# Patient Record
Sex: Male | Born: 1955 | Race: White | Hispanic: No | Marital: Married
Health system: Southern US, Community
[De-identification: ages and names within clinical notes are randomized; demographics above are authoritative.]

## PROBLEM LIST (undated history)

## (undated) DIAGNOSIS — I10 Essential (primary) hypertension: Secondary | ICD-10-CM

## (undated) DIAGNOSIS — E119 Type 2 diabetes mellitus without complications: Secondary | ICD-10-CM

## (undated) DIAGNOSIS — K859 Acute pancreatitis without necrosis or infection, unspecified: Secondary | ICD-10-CM

---

## 2014-05-02 ENCOUNTER — Emergency Department (HOSPITAL_BASED_OUTPATIENT_CLINIC_OR_DEPARTMENT_OTHER)
Admission: EM | Admit: 2014-05-02 | Discharge: 2014-05-02 | Disposition: A | Discharge status: Home / Self Care | Payer: Worker's Compensation | Attending: Emergency Medicine | Admitting: Emergency Medicine

## 2014-05-02 ENCOUNTER — Emergency Department (HOSPITAL_BASED_OUTPATIENT_CLINIC_OR_DEPARTMENT_OTHER): Payer: Worker's Compensation

## 2014-05-02 ENCOUNTER — Encounter (HOSPITAL_BASED_OUTPATIENT_CLINIC_OR_DEPARTMENT_OTHER): Payer: Self-pay | Admitting: Emergency Medicine

## 2014-05-02 DIAGNOSIS — Y929 Unspecified place or not applicable: Secondary | ICD-10-CM | POA: Insufficient documentation

## 2014-05-02 DIAGNOSIS — T07XXXA Unspecified multiple injuries, initial encounter: Secondary | ICD-10-CM | POA: Insufficient documentation

## 2014-05-02 DIAGNOSIS — S39012A Strain of muscle, fascia and tendon of lower back, initial encounter: Secondary | ICD-10-CM

## 2014-05-02 DIAGNOSIS — I10 Essential (primary) hypertension: Secondary | ICD-10-CM | POA: Insufficient documentation

## 2014-05-02 DIAGNOSIS — Z79899 Other long term (current) drug therapy: Secondary | ICD-10-CM | POA: Insufficient documentation

## 2014-05-02 DIAGNOSIS — E119 Type 2 diabetes mellitus without complications: Secondary | ICD-10-CM | POA: Insufficient documentation

## 2014-05-02 DIAGNOSIS — S199XXA Unspecified injury of neck, initial encounter: Secondary | ICD-10-CM

## 2014-05-02 DIAGNOSIS — S139XXA Sprain of joints and ligaments of unspecified parts of neck, initial encounter: Secondary | ICD-10-CM | POA: Insufficient documentation

## 2014-05-02 DIAGNOSIS — W11XXXA Fall on and from ladder, initial encounter: Secondary | ICD-10-CM | POA: Insufficient documentation

## 2014-05-02 DIAGNOSIS — S0993XA Unspecified injury of face, initial encounter: Secondary | ICD-10-CM | POA: Insufficient documentation

## 2014-05-02 DIAGNOSIS — S0990XA Unspecified injury of head, initial encounter: Secondary | ICD-10-CM | POA: Insufficient documentation

## 2014-05-02 DIAGNOSIS — Y9389 Activity, other specified: Secondary | ICD-10-CM | POA: Insufficient documentation

## 2014-05-02 DIAGNOSIS — Z8719 Personal history of other diseases of the digestive system: Secondary | ICD-10-CM | POA: Insufficient documentation

## 2014-05-02 HISTORY — DX: Type 2 diabetes mellitus without complications: E11.9

## 2014-05-02 HISTORY — DX: Essential (primary) hypertension: I10

## 2014-05-02 HISTORY — DX: Acute pancreatitis without necrosis or infection, unspecified: K85.90

## 2014-05-02 LAB — ETHANOL: Alcohol, Ethyl (B): <11 mg/dL (ref 0–11)

## 2014-05-02 LAB — COMPREHENSIVE METABOLIC PANEL
ALT: 22 U/L (ref 0–53)
AST: 19 U/L (ref 0–37)
Albumin: 3.5 g/dL (ref 3.5–5.2)
Alkaline Phosphatase: 82 U/L (ref 39–117)
Anion gap: 13 (ref 5–15)
BUN: 13 mg/dL (ref 6–23)
CO2: 27 meq/L (ref 19–32)
CREATININE: 1 mg/dL (ref 0.50–1.35)
Calcium: 9.9 mg/dL (ref 8.4–10.5)
Chloride: 99 mEq/L (ref 96–112)
GFR, EST NON AFRICAN AMERICAN: 82 mL/min — AB (ref >90)
GLUCOSE: 135 mg/dL — AB (ref 70–99)
Potassium: 3.8 mEq/L (ref 3.7–5.3)
Sodium: 139 mEq/L (ref 137–147)
Total Bilirubin: <0.2 mg/dL — ABNORMAL LOW (ref 0.3–1.2)
Total Protein: 7.1 g/dL (ref 6.0–8.3)

## 2014-05-02 LAB — PROTIME-INR
INR: 0.94 (ref 0.00–1.49)
Prothrombin Time: 12.6 seconds (ref 11.6–15.2)

## 2014-05-02 LAB — CBC
HCT: 44 % (ref 39.0–52.0)
Hemoglobin: 15.3 g/dL (ref 13.0–17.0)
MCH: 29.1 pg (ref 26.0–34.0)
MCHC: 34.8 g/dL (ref 30.0–36.0)
MCV: 83.7 fL (ref 78.0–100.0)
Platelets: 116 10*3/uL — ABNORMAL LOW (ref 150–400)
RBC: 5.26 MIL/uL (ref 4.22–5.81)
RDW: 14.6 % (ref 11.5–15.5)
WBC: 9.1 10*3/uL (ref 4.0–10.5)

## 2014-05-02 MED ORDER — CYCLOBENZAPRINE HCL 10 MG PO TABS: 10.0000 mg | ORAL_TABLET | Freq: Two times a day (BID) | ORAL | Status: AC | PRN

## 2014-05-02 MED ORDER — OXYCODONE-ACETAMINOPHEN 5-325 MG PO TABS
1.0000 | ORAL_TABLET | ORAL | Status: AC | PRN
Start: 2014-05-02 — End: ?

## 2014-05-02 MED ORDER — OXYCODONE-ACETAMINOPHEN 5-325 MG PO TABS
2.0000 | ORAL_TABLET | Freq: Once | ORAL | Status: AC
  Administered 2014-05-02: 2 via ORAL
  Filled 2014-05-02: qty 2

## 2014-05-02 NOTE — ED Provider Notes (Signed)
CSN: [id number]     Arrival date & time 05/02/14  1013 History   First MD Initiated Contact with Patient 05/02/14 1026     Chief Complaint  Patient presents with  . Fall     (Consider location/radiation/quality/duration/timing/severity/associated sxs/prior Treatment) HPI Comments: Patient presents after a fall. He states that he was working on a ladder and fell backward about 6 feet, landing on a concrete floor. He landed on his back in his head. He had a positive loss of consciousness. He was seen at an outside facility and per their report, he was drowsy. He was sent over here for further evaluation. He came over here and his safety officers car. He complains of a headache. He has no nausea or vomiting. He denies any chest pain or shortness of breath. He denies abdominal pain. He has pain in his right elbow and right knee as well as behind his left shoulder blade. He also has some pain in his low back. He has diabetic neuropathy but denies any new numbness or weakness to his extremities. He denies taking any anticoagulants.  Patient is a 58 y.o. male presenting with fall.  Fall Associated symptoms include headaches. Pertinent negatives include no chest pain, no abdominal pain and no shortness of breath.    Past Medical History  Diagnosis Date  . Diabetes mellitus without complication   . Hypertension   . Pancreatitis    History reviewed. No pertinent past surgical history. No family history on file. History  Substance Use Topics  . Smoking status: Not on file  . Smokeless tobacco: Current User  . Alcohol Use: Not on file    Review of Systems  Constitutional: Negative for fever, chills, diaphoresis and fatigue.  HENT: Negative for congestion, rhinorrhea and sneezing.   Eyes: Negative.   Respiratory: Negative for cough, chest tightness and shortness of breath.   Cardiovascular: Negative for chest pain and leg swelling.  Gastrointestinal: Negative for nausea, vomiting, abdominal  pain, diarrhea and blood in stool.  Genitourinary: Negative for frequency, hematuria, flank pain and difficulty urinating.  Musculoskeletal: Positive for arthralgias, back pain and neck pain.  Skin: Negative for rash.  Neurological: Positive for syncope and headaches. Negative for dizziness, speech difficulty, weakness and numbness.      Allergies  Meperidine and related and Morphine and related  Home Medications   Prior to Admission medications   Medication Sig Start Date End Date Taking? Authorizing Provider  HYDROcodone-acetaminophen (NORCO/VICODIN) 5-325 MG per tablet Take 1 tablet by mouth every 6 (six) hours as needed for moderate pain.   Yes Historical Provider, MD  lisinopril (PRINIVIL,ZESTRIL) 20 MG tablet Take 20 mg by mouth daily.   Yes Historical Provider, MD  metFORMIN (GLUMETZA) 500 MG (MOD) 24 hr tablet Take 500 mg by mouth 2 (two) times daily.   Yes Historical Provider, MD  promethazine (PHENERGAN) 25 MG tablet Take 25 mg by mouth every 6 (six) hours as needed for nausea or vomiting.   Yes Historical Provider, MD  tadalafil (CIALIS) 20 MG tablet Take 20 mg by mouth daily as needed for erectile dysfunction.   Yes Historical Provider, MD  cyclobenzaprine (FLEXERIL) 10 MG tablet Take 1 tablet (10 mg total) by mouth 2 (two) times daily as needed for muscle spasms. 05/02/14   Rolan BuccoMelanie Herlinda Heady, MD  oxyCODONE-acetaminophen (PERCOCET) 5-325 MG per tablet Take 1-2 tablets by mouth every 4 (four) hours as needed. 05/02/14   Rolan BuccoMelanie Tramayne Sebesta, MD   BP 167/88  Pulse 74  Temp(Src)  98.9 F (37.2 C) (Oral)  Resp 18  Ht 6' (1.829 m)  Wt 265 lb (120.203 kg)  BMI 35.93 kg/m2  SpO2 98% Physical Exam  Constitutional: He is oriented to person, place, and time. He appears well-developed and well-nourished.  HENT:  Head: Normocephalic and atraumatic.  Right Ear: External ear normal.  Left Ear: External ear normal.  Eyes: Pupils are equal, round, and reactive to light.  Neck:  Patient is in a  c-collar. He has tenderness throughout the cervical spine. He has a short neck. He has no tenderness to the thoracic spine. He has tenderness to the mid and lower lumbar spine. No step-offs or deformities are noted.  Cardiovascular: Normal rate, regular rhythm and normal heart sounds.   Pulmonary/Chest: Effort normal and breath sounds normal. No respiratory distress. He has no wheezes. He has no rales. He exhibits no tenderness.  There is no crepitus or deformity. There's no external signs of trauma. There is tenderness along the left scapula.  Abdominal: Soft. Bowel sounds are normal. There is no tenderness. There is no rebound and no guarding.  Musculoskeletal: Normal range of motion. He exhibits no edema.  Positive tenderness on palpation of the right elbow. There's also pain on palpation of the right knee.   Lymphadenopathy:    He has no cervical adenopathy.  Neurological: He is alert and oriented to person, place, and time.  Skin: Skin is warm and dry. No rash noted.  Psychiatric: He has a normal mood and affect.    ED Course  Procedures (including critical care time) Labs Review Results for orders placed during the hospital encounter of 05/02/14  COMPREHENSIVE METABOLIC PANEL      Result Value Ref Range   Sodium 139  137 - 147 mEq/L   Potassium 3.8  3.7 - 5.3 mEq/L   Chloride 99  96 - 112 mEq/L   CO2 27  19 - 32 mEq/L   Glucose, Bld 135 (*) 70 - 99 mg/dL   BUN 13  6 - 23 mg/dL   Creatinine, Ser 1.61  0.50 - 1.35 mg/dL   Calcium 9.9  8.4 - 09.6 mg/dL   Total Protein 7.1  6.0 - 8.3 g/dL   Albumin 3.5  3.5 - 5.2 g/dL   AST 19  0 - 37 U/L   ALT 22  0 - 53 U/L   Alkaline Phosphatase 82  39 - 117 U/L   Total Bilirubin <0.2 (*) 0.3 - 1.2 mg/dL   GFR calc non Af Amer 82 (*) >90 mL/min   GFR calc Af Amer >90  >90 mL/min   Anion gap 13  5 - 15  CBC      Result Value Ref Range   WBC 9.1  4.0 - 10.5 K/uL   RBC 5.26  4.22 - 5.81 MIL/uL   Hemoglobin 15.3  13.0 - 17.0 g/dL   HCT 04.5   40.9 - 81.1 %   MCV 83.7  78.0 - 100.0 fL   MCH 29.1  26.0 - 34.0 pg   MCHC 34.8  30.0 - 36.0 g/dL   RDW 91.4  78.2 - 95.6 %   Platelets 116 (*) 150 - 400 K/uL  ETHANOL      Result Value Ref Range   Alcohol, Ethyl (B) <11  0 - 11 mg/dL  PROTIME-INR      Result Value Ref Range   Prothrombin Time 12.6  11.6 - 15.2 seconds   INR 0.94  0.00 - 1.49  Dg Chest 2 View  05/02/2014   CLINICAL DATA:  Fall.  EXAM: CHEST  2 VIEW  COMPARISON:  None.  FINDINGS: Mediastinum and hilar structures are normal. Poor inspiration mild basilar atelectasis. Lungs are otherwise clear. No pleural effusion or pneumothorax. Heart size normal. Surgical clips right upper quadrant. No acute bony abnormality.  IMPRESSION: No active cardiopulmonary disease.   Electronically Signed   By: Maisie Fus  Register   On: 05/02/2014 11:18   Dg Lumbar Spine Complete  05/02/2014   CLINICAL DATA:  Fall  EXAM: LUMBAR SPINE - COMPLETE 4+ VIEW  COMPARISON:  None.  FINDINGS: Anatomic alignment. No vertebral compression deformity. Severe narrowing of the L4-5 and L5-S1 discs. Facet arthropathy at L4-5 and L5-S1. Prominent vascular calcifications.  IMPRESSION: No acute bony pathology.  Degenerative change.   Electronically Signed   By: Maryclare Bean M.D.   On: 05/02/2014 11:22   Dg Elbow Complete Right  05/02/2014   CLINICAL DATA:  Fall  EXAM: RIGHT ELBOW - COMPLETE 3+ VIEW  COMPARISON:  None.  FINDINGS: No acute fracture.  No dislocation.  No joint effusion.  IMPRESSION: No acute bony pathology.   Electronically Signed   By: Maryclare Bean M.D.   On: 05/02/2014 11:20   Ct Head Wo Contrast  05/02/2014   CLINICAL DATA:  Status post fall, loss consciousness  EXAM: CT HEAD WITHOUT CONTRAST  CT CERVICAL SPINE WITHOUT CONTRAST  TECHNIQUE: Multidetector CT imaging of the head and cervical spine was performed following the standard protocol without intravenous contrast. Multiplanar CT image reconstructions of the cervical spine were also generated.  COMPARISON:   None.  FINDINGS: CT HEAD FINDINGS  There is no evidence of mass effect, midline shift, or extra-axial fluid collections. There is no evidence of a space-occupying lesion or intracranial hemorrhage. There is no evidence of a cortical-based area of acute infarction. There is generalized cerebral atrophy.  The ventricles and sulci are appropriate for the patient's age. The basal cisterns are patent.  Visualized portions of the orbits are unremarkable. The visualized portions of the paranasal sinuses and mastoid air cells are unremarkable. Cerebrovascular atherosclerotic calcifications are noted.  The osseous structures are unremarkable.  CT CERVICAL SPINE FINDINGS  The alignment is anatomic. The vertebral body heights are maintained. There is no acute fracture. There is no static listhesis. The prevertebral soft tissues are normal. The intraspinal soft tissues are not fully imaged on this examination due to poor soft tissue contrast, but there is no gross soft tissue abnormality.  There is degenerative disc disease with disc height loss at C5-6 and C6-7. There is loss the normal cervical lordosis with straightening. There are mild broad-based disc bulges at C5-6 and C6-7. There is mild left foraminal stenosis at C4-5. There is bilateral uncovertebral degenerative changes C6-7 resulting in foraminal encroachment.  The visualized portions of the lung apices demonstrate no focal abnormality.There is bilateral carotid artery atherosclerosis.  IMPRESSION: 1. No acute intracranial pathology. 2. No acute osseous injury of the cervical spine. 3. Cervical spine spondylosis as described above.   Electronically Signed   By: Elige Ko   On: 05/02/2014 11:12   Ct Cervical Spine Wo Contrast  05/02/2014   CLINICAL DATA:  Status post fall, loss consciousness  EXAM: CT HEAD WITHOUT CONTRAST  CT CERVICAL SPINE WITHOUT CONTRAST  TECHNIQUE: Multidetector CT imaging of the head and cervical spine was performed following the standard  protocol without intravenous contrast. Multiplanar CT image reconstructions of the cervical spine were also generated.  COMPARISON:  None.  FINDINGS: CT HEAD FINDINGS  There is no evidence of mass effect, midline shift, or extra-axial fluid collections. There is no evidence of a space-occupying lesion or intracranial hemorrhage. There is no evidence of a cortical-based area of acute infarction. There is generalized cerebral atrophy.  The ventricles and sulci are appropriate for the patient's age. The basal cisterns are patent.  Visualized portions of the orbits are unremarkable. The visualized portions of the paranasal sinuses and mastoid air cells are unremarkable. Cerebrovascular atherosclerotic calcifications are noted.  The osseous structures are unremarkable.  CT CERVICAL SPINE FINDINGS  The alignment is anatomic. The vertebral body heights are maintained. There is no acute fracture. There is no static listhesis. The prevertebral soft tissues are normal. The intraspinal soft tissues are not fully imaged on this examination due to poor soft tissue contrast, but there is no gross soft tissue abnormality.  There is degenerative disc disease with disc height loss at C5-6 and C6-7. There is loss the normal cervical lordosis with straightening. There are mild broad-based disc bulges at C5-6 and C6-7. There is mild left foraminal stenosis at C4-5. There is bilateral uncovertebral degenerative changes C6-7 resulting in foraminal encroachment.  The visualized portions of the lung apices demonstrate no focal abnormality.There is bilateral carotid artery atherosclerosis.  IMPRESSION: 1. No acute intracranial pathology. 2. No acute osseous injury of the cervical spine. 3. Cervical spine spondylosis as described above.   Electronically Signed   By: Elige Ko   On: 05/02/2014 11:12   Dg Knee Complete 4 Views Right  05/02/2014   CLINICAL DATA:  Fall.  EXAM: RIGHT KNEE - COMPLETE 4+ VIEW  COMPARISON:  None.  FINDINGS:  Peripheral vascular calcification consistent peripheral vascular disease noted. No acute bony or joint abnormality identified.  IMPRESSION: 1.  No acute abnormality.  2.  Peripheral vascular disease.   Electronically Signed   By: Maisie Fus  Register   On: 05/02/2014 11:44      Imaging Review Dg Chest 2 View  05/02/2014   CLINICAL DATA:  Fall.  EXAM: CHEST  2 VIEW  COMPARISON:  None.  FINDINGS: Mediastinum and hilar structures are normal. Poor inspiration mild basilar atelectasis. Lungs are otherwise clear. No pleural effusion or pneumothorax. Heart size normal. Surgical clips right upper quadrant. No acute bony abnormality.  IMPRESSION: No active cardiopulmonary disease.   Electronically Signed   By: Maisie Fus  Register   On: 05/02/2014 11:18   Dg Lumbar Spine Complete  05/02/2014   CLINICAL DATA:  Fall  EXAM: LUMBAR SPINE - COMPLETE 4+ VIEW  COMPARISON:  None.  FINDINGS: Anatomic alignment. No vertebral compression deformity. Severe narrowing of the L4-5 and L5-S1 discs. Facet arthropathy at L4-5 and L5-S1. Prominent vascular calcifications.  IMPRESSION: No acute bony pathology.  Degenerative change.   Electronically Signed   By: Maryclare Bean M.D.   On: 05/02/2014 11:22   Dg Elbow Complete Right  05/02/2014   CLINICAL DATA:  Fall  EXAM: RIGHT ELBOW - COMPLETE 3+ VIEW  COMPARISON:  None.  FINDINGS: No acute fracture.  No dislocation.  No joint effusion.  IMPRESSION: No acute bony pathology.   Electronically Signed   By: Maryclare Bean M.D.   On: 05/02/2014 11:20   Ct Head Wo Contrast  05/02/2014   CLINICAL DATA:  Status post fall, loss consciousness  EXAM: CT HEAD WITHOUT CONTRAST  CT CERVICAL SPINE WITHOUT CONTRAST  TECHNIQUE: Multidetector CT imaging of the head and cervical spine was performed following the  standard protocol without intravenous contrast. Multiplanar CT image reconstructions of the cervical spine were also generated.  COMPARISON:  None.  FINDINGS: CT HEAD FINDINGS  There is no evidence of mass  effect, midline shift, or extra-axial fluid collections. There is no evidence of a space-occupying lesion or intracranial hemorrhage. There is no evidence of a cortical-based area of acute infarction. There is generalized cerebral atrophy.  The ventricles and sulci are appropriate for the patient's age. The basal cisterns are patent.  Visualized portions of the orbits are unremarkable. The visualized portions of the paranasal sinuses and mastoid air cells are unremarkable. Cerebrovascular atherosclerotic calcifications are noted.  The osseous structures are unremarkable.  CT CERVICAL SPINE FINDINGS  The alignment is anatomic. The vertebral body heights are maintained. There is no acute fracture. There is no static listhesis. The prevertebral soft tissues are normal. The intraspinal soft tissues are not fully imaged on this examination due to poor soft tissue contrast, but there is no gross soft tissue abnormality.  There is degenerative disc disease with disc height loss at C5-6 and C6-7. There is loss the normal cervical lordosis with straightening. There are mild broad-based disc bulges at C5-6 and C6-7. There is mild left foraminal stenosis at C4-5. There is bilateral uncovertebral degenerative changes C6-7 resulting in foraminal encroachment.  The visualized portions of the lung apices demonstrate no focal abnormality.There is bilateral carotid artery atherosclerosis.  IMPRESSION: 1. No acute intracranial pathology. 2. No acute osseous injury of the cervical spine. 3. Cervical spine spondylosis as described above.   Electronically Signed   By: Elige Ko   On: 05/02/2014 11:12   Ct Cervical Spine Wo Contrast  05/02/2014   CLINICAL DATA:  Status post fall, loss consciousness  EXAM: CT HEAD WITHOUT CONTRAST  CT CERVICAL SPINE WITHOUT CONTRAST  TECHNIQUE: Multidetector CT imaging of the head and cervical spine was performed following the standard protocol without intravenous contrast. Multiplanar CT image  reconstructions of the cervical spine were also generated.  COMPARISON:  None.  FINDINGS: CT HEAD FINDINGS  There is no evidence of mass effect, midline shift, or extra-axial fluid collections. There is no evidence of a space-occupying lesion or intracranial hemorrhage. There is no evidence of a cortical-based area of acute infarction. There is generalized cerebral atrophy.  The ventricles and sulci are appropriate for the patient's age. The basal cisterns are patent.  Visualized portions of the orbits are unremarkable. The visualized portions of the paranasal sinuses and mastoid air cells are unremarkable. Cerebrovascular atherosclerotic calcifications are noted.  The osseous structures are unremarkable.  CT CERVICAL SPINE FINDINGS  The alignment is anatomic. The vertebral body heights are maintained. There is no acute fracture. There is no static listhesis. The prevertebral soft tissues are normal. The intraspinal soft tissues are not fully imaged on this examination due to poor soft tissue contrast, but there is no gross soft tissue abnormality.  There is degenerative disc disease with disc height loss at C5-6 and C6-7. There is loss the normal cervical lordosis with straightening. There are mild broad-based disc bulges at C5-6 and C6-7. There is mild left foraminal stenosis at C4-5. There is bilateral uncovertebral degenerative changes C6-7 resulting in foraminal encroachment.  The visualized portions of the lung apices demonstrate no focal abnormality.There is bilateral carotid artery atherosclerosis.  IMPRESSION: 1. No acute intracranial pathology. 2. No acute osseous injury of the cervical spine. 3. Cervical spine spondylosis as described above.   Electronically Signed   By: Elige Ko  On: 05/02/2014 11:12   Dg Knee Complete 4 Views Right  05/02/2014   CLINICAL DATA:  Fall.  EXAM: RIGHT KNEE - COMPLETE 4+ VIEW  COMPARISON:  None.  FINDINGS: Peripheral vascular calcification consistent peripheral  vascular disease noted. No acute bony or joint abnormality identified.  IMPRESSION: 1.  No acute abnormality.  2.  Peripheral vascular disease.   Electronically Signed   By: Maisie Fus  Register   On: 05/02/2014 11:44     EKG Interpretation None      MDM   Final diagnoses:  Head injury, initial encounter  Back strain, initial encounter  Multiple contusions    Patient presents after a fall. He is alert and oriented and neurologically intact. His CT scan is negative for intracranial hemorrhage. He has no evident cervical spine injuries. His other x-rays are unremarkable. He was discharged home with prescriptions for Percocet and Flexeril. He was encouraged to followup with his primary care physician at [facility] for recheck on his blood pressure. I also advised him he's got some arterial disease that was seen on his x-rays.    Rolan Bucco, MD 05/02/14 [phone]

## 2014-05-02 NOTE — Discharge Instructions (Signed)
Back Pain, Adult °Low back pain is very common. About 1 in 5 people have back pain. The cause of low back pain is rarely dangerous. The pain often gets better over time. About half of people with a sudden onset of back pain feel better in just 2 weeks. About 8 in 10 people feel better by 6 weeks.  °CAUSES °Some common causes of back pain include: °· Strain of the muscles or ligaments supporting the spine. °· Wear and tear (degeneration) of the spinal discs. °· Arthritis. °· Direct injury to the back. °DIAGNOSIS °Most of the time, the direct cause of low back pain is not known. However, back pain can be treated effectively even when the exact cause of the pain is unknown. Answering your caregiver's questions about your overall health and symptoms is one of the most accurate ways to make sure the cause of your pain is not dangerous. If your caregiver needs more information, he or she may order lab work or imaging tests (X-rays or MRIs). However, even if imaging tests show changes in your back, this usually does not require surgery. °HOME CARE INSTRUCTIONS °For many people, back pain returns. Since low back pain is rarely dangerous, it is often a condition that people can learn to manage on their own.  °· Remain active. It is stressful on the back to sit or stand in one place. Do not sit, drive, or stand in one place for more than 30 minutes at a time. Take short walks on level surfaces as soon as pain allows. Try to increase the length of time you walk each day. °· Do not stay in bed. Resting more than 1 or 2 days can delay your recovery. °· Do not avoid exercise or work. Your body is made to move. It is not dangerous to be active, even though your back may hurt. Your back will likely heal faster if you return to being active before your pain is gone. °· Pay attention to your body when you  bend and lift. Many people have less discomfort when lifting if they bend their knees, keep the load close to their bodies, and  avoid twisting. Often, the most comfortable positions are those that put less stress on your recovering back. °· Find a comfortable position to sleep. Use a firm mattress and lie on your side with your knees slightly bent. If you lie on your back, put a pillow under your knees. °· Only take over-the-counter or prescription medicines as directed by your caregiver. Over-the-counter medicines to reduce pain and inflammation are often the most helpful. Your caregiver may prescribe muscle relaxant drugs. These medicines help dull your pain so you can more quickly return to your normal activities and healthy exercise. °· Put ice on the injured area. °¨ Put ice in a plastic bag. °¨ Place a towel between your skin and the bag. °¨ Leave the ice on for 15-20 minutes, 03-04 times a day for the first 2 to 3 days. After that, ice and heat may be alternated to reduce pain and spasms. °· Ask your caregiver about trying back exercises and gentle massage. This may be of some benefit. °· Avoid feeling anxious or stressed. Stress increases muscle tension and can worsen back pain. It is important to recognize when you are anxious or stressed and learn ways to manage it. Exercise is a great option. °SEEK MEDICAL CARE IF: °· You have pain that is not relieved with rest or medicine. °· You have pain that does not improve in 1 week. °· You have new symptoms. °· You are generally not feeling well. °SEEK   IMMEDIATE MEDICAL CARE IF:   You have pain that radiates from your back into your legs.  You develop new bowel or bladder control problems.  You have unusual weakness or numbness in your arms or legs.  You develop nausea or vomiting.  You develop abdominal pain.  You feel faint. Document Released: 09/14/2005 Document Revised: 03/15/2012 Document Reviewed: 01/16/2014 [facility] Patient Information 2015 [place], [place]. This information is not intended to replace advice given to you by your health care provider. Make sure you  discuss any questions you have with your health care provider.  Concussion A concussion, or closed-head injury, is a brain injury caused by a direct blow to the head or by a quick and sudden movement (jolt) of the head or neck. Concussions are usually not life-threatening. Even so, the effects of a concussion can be serious. If you have had a concussion before, you are more likely to experience concussion-like symptoms after a direct blow to the head.  CAUSES  Direct blow to the head, such as from running into another player during a soccer game, being hit in a fight, or hitting your head on a hard surface.  A jolt of the head or neck that causes the brain to move back and forth inside the skull, such as in a car crash. SIGNS AND SYMPTOMS The signs of a concussion can be hard to notice. Early on, they may be missed by you, family members, and health care providers. You may look fine but act or feel differently. Symptoms are usually temporary, but they may last for days, weeks, or even longer. Some symptoms may appear right away while others may not show up for hours or days. Every head injury is different. Symptoms include:  Mild to moderate headaches that will not go away.  A feeling of pressure inside your head.  Having more trouble than usual:  Learning or remembering things you have heard.  Answering questions.  Paying attention or concentrating.  Organizing daily tasks.  Making decisions and solving problems.  Slowness in thinking, acting or reacting, speaking, or reading.  Getting lost or being easily confused.  Feeling tired all the time or lacking energy (fatigued).  Feeling drowsy.  Sleep disturbances.  Sleeping more than usual.  Sleeping less than usual.  Trouble falling asleep.  Trouble sleeping (insomnia).  Loss of balance or feeling lightheaded or dizzy.  Nausea or vomiting.  Numbness or tingling.  Increased sensitivity  to:  Sounds.  Lights.  Distractions.  Vision problems or eyes that tire easily.  Diminished sense of taste or smell.  Ringing in the ears.  Mood changes such as feeling sad or anxious.  Becoming easily irritated or angry for little or no reason.  Lack of motivation.  Seeing or hearing things other people do not see or hear (hallucinations). DIAGNOSIS Your health care provider can usually diagnose a concussion based on a description of your injury and symptoms. He or she will ask whether you passed out (lost consciousness) and whether you are having trouble remembering events that happened right before and during your injury. Your evaluation might include:  A brain scan to look for signs of injury to the brain. Even if the test shows no injury, you may still have a concussion.  Blood tests to be sure other problems are not present. TREATMENT  Concussions are usually treated in an emergency department, in urgent care, or at a clinic. You may need to stay in the hospital overnight for further treatment.  Tell your health care provider if you are taking any medicines, including prescription medicines, over-the-counter medicines, and natural remedies. Some medicines, such as blood thinners (anticoagulants) and aspirin, may increase the chance of complications. Also tell your health care provider whether you have had alcohol or are taking illegal drugs. This information may affect treatment.  Your health care provider will send you home with important instructions to follow.  How fast you will recover from a concussion depends on many factors. These factors include how severe your concussion is, what part of your brain was injured, your age, and how healthy you were before the concussion.  Most people with mild injuries recover fully. Recovery can take time. In general, recovery is slower in older persons. Also, persons who have had a concussion in the past or have other medical  problems may find that it takes longer to recover from their current injury. HOME CARE INSTRUCTIONS General Instructions  Carefully follow the directions your health care provider gave you.  Only take over-the-counter or prescription medicines for pain, discomfort, or fever as directed by your health care provider.  Take only those medicines that your health care provider has approved.  Do not drink alcohol until your health care provider says you are well enough to do so. Alcohol and certain other drugs may slow your recovery and can put you at risk of further injury.  If it is harder than usual to remember things, write them down.  If you are easily distracted, try to do one thing at a time. For example, do not try to watch TV while fixing dinner.  Talk with family members or close friends when making important decisions.  Keep all follow-up appointments. Repeated evaluation of your symptoms is recommended for your recovery.  Watch your symptoms and tell others to do the same. Complications sometimes occur after a concussion. Older adults with a brain injury may have a higher risk of serious complications, such as a blood clot on the brain.  Tell your teachers, school nurse, school counselor, coach, athletic trainer, or work Freight forwarder about your injury, symptoms, and restrictions. Tell them about what you can or cannot do. They should watch for:  Increased problems with attention or concentration.  Increased difficulty remembering or learning new information.  Increased time needed to complete tasks or assignments.  Increased irritability or decreased ability to cope with stress.  Increased symptoms.  Rest. Rest helps the brain to heal. Make sure you:  Get plenty of sleep at night. Avoid staying up late at night.  Keep the same bedtime hours on weekends and weekdays.  Rest during the day. Take daytime naps or rest breaks when you feel tired.  Limit activities that require a  lot of thought or concentration. These include:  Doing homework or job-related work.  Watching TV.  Working on the computer.  Avoid any situation where there is potential for another head injury (football, hockey, soccer, basketball, martial arts, downhill snow sports and horseback riding). Your condition will get worse every time you experience a concussion. You should avoid these activities until you are evaluated by the appropriate follow-up health care providers. Returning To Your Regular Activities You will need to return to your normal activities slowly, not all at once. You must give your body and brain enough time for recovery.  Do not return to sports or other athletic activities until your health care provider tells you it is safe to do so.  Ask your health care provider when  you can drive, ride a bicycle, or operate heavy machinery. Your ability to react may be slower after a brain injury. Never do these activities if you are dizzy.  Ask your health care provider about when you can return to work or school. Preventing Another Concussion It is very important to avoid another brain injury, especially before you have recovered. In rare cases, another injury can lead to permanent brain damage, brain swelling, or death. The risk of this is greatest during the first 7-10 days after a head injury. Avoid injuries by:  Wearing a seat belt when riding in a car.  Drinking alcohol only in moderation.  Wearing a helmet when biking, skiing, skateboarding, skating, or doing similar activities.  Avoiding activities that could lead to a second concussion, such as contact or recreational sports, until your health care provider says it is okay.  Taking safety measures in your home.  Remove clutter and tripping hazards from floors and stairways.  Use grab bars in bathrooms and handrails by stairs.  Place non-slip mats on floors and in bathtubs.  Improve lighting in dim areas. SEEK MEDICAL  CARE IF:  You have increased problems paying attention or concentrating.  You have increased difficulty remembering or learning new information.  You need more time to complete tasks or assignments than before.  You have increased irritability or decreased ability to cope with stress.  You have more symptoms than before. Seek medical care if you have any of the following symptoms for more than 2 weeks after your injury:  Lasting (chronic) headaches.  Dizziness or balance problems.  Nausea.  Vision problems.  Increased sensitivity to noise or light.  Depression or mood swings.  Anxiety or irritability.  Memory problems.  Difficulty concentrating or paying attention.  Sleep problems.  Feeling tired all the time. SEEK IMMEDIATE MEDICAL CARE IF:  You have severe or worsening headaches. These may be a sign of a blood clot in the brain.  You have weakness (even if only in one hand, leg, or part of the face).  You have numbness.  You have decreased coordination.  You vomit repeatedly.  You have increased sleepiness.  One pupil is larger than the other.  You have convulsions.  You have slurred speech.  You have increased confusion. This may be a sign of a blood clot in the brain.  You have increased restlessness, agitation, or irritability.  You are unable to recognize people or places.  You have neck pain.  It is difficult to wake you up.  You have unusual behavior changes.  You lose consciousness. MAKE SURE YOU:  Understand these instructions.  Will watch your condition.  Will get help right away if you are not doing well or get worse. Document Released: 12/05/2003 Document Revised: 09/19/2013 Document Reviewed: 04/06/2013 [facility] Patient Information 2015 [place], [place]. This information is not intended to replace advice given to you by your health care provider. Make sure you discuss any questions you have with your health care  provider.  Contusion A contusion is a deep bruise. Contusions are the result of an injury that caused bleeding under the skin. The contusion may turn blue, purple, or yellow. Minor injuries will give you a painless contusion, but more severe contusions may stay painful and swollen for a few weeks.  CAUSES  A contusion is usually caused by a blow, trauma, or direct force to an area of the body. SYMPTOMS   Swelling and redness of the injured area.  Bruising of  the injured area.  Tenderness and soreness of the injured area.  Pain. DIAGNOSIS  The diagnosis can be made by taking a history and physical exam. An X-ray, CT scan, or MRI may be needed to determine if there were any associated injuries, such as fractures. TREATMENT  Specific treatment will depend on what area of the body was injured. In general, the best treatment for a contusion is resting, icing, elevating, and applying cold compresses to the injured area. Over-the-counter medicines may also be recommended for pain control. Ask your caregiver what the best treatment is for your contusion. HOME CARE INSTRUCTIONS   Put ice on the injured area.  Put ice in a plastic bag.  Place a towel between your skin and the bag.  Leave the ice on for 15-20 minutes, 3-4 times a day, or as directed by your health care provider.  Only take over-the-counter or prescription medicines for pain, discomfort, or fever as directed by your caregiver. Your caregiver may recommend avoiding anti-inflammatory medicines (aspirin, ibuprofen, and naproxen) for 48 hours because these medicines may increase bruising.  Rest the injured area.  If possible, elevate the injured area to reduce swelling. SEEK IMMEDIATE MEDICAL CARE IF:   You have increased bruising or swelling.  You have pain that is getting worse.  Your swelling or pain is not relieved with medicines. MAKE SURE YOU:   Understand these instructions.  Will watch your condition.  Will get  help right away if you are not doing well or get worse. Document Released: 06/24/2005 Document Revised: 09/19/2013 Document Reviewed: 07/20/2011 [facility] Patient Information 2015 [place], [place]. This information is not intended to replace advice given to you by your health care provider. Make sure you discuss any questions you have with your health care provider.

## 2014-05-02 NOTE — ED Notes (Signed)
Pt fell 4 ft off ladder, fell on back and head.States he had +LOC. MAE Pt placed in c-collar on arrival to room.Alert and oriented. Follows commands. No visible injuries. C/o left shoulder blade pain, left hip, back of head and neck, rt elbow and rt knee pain.

## 2015-10-09 IMAGING — CR DG ELBOW COMPLETE 3+V*R*
4 series · 4 of 4 positions shown · non-contrast
Comparison: None.

CLINICAL DATA: Fall

EXAM:
RIGHT ELBOW - COMPLETE 3+ VIEW

[x elbow joint ap right]
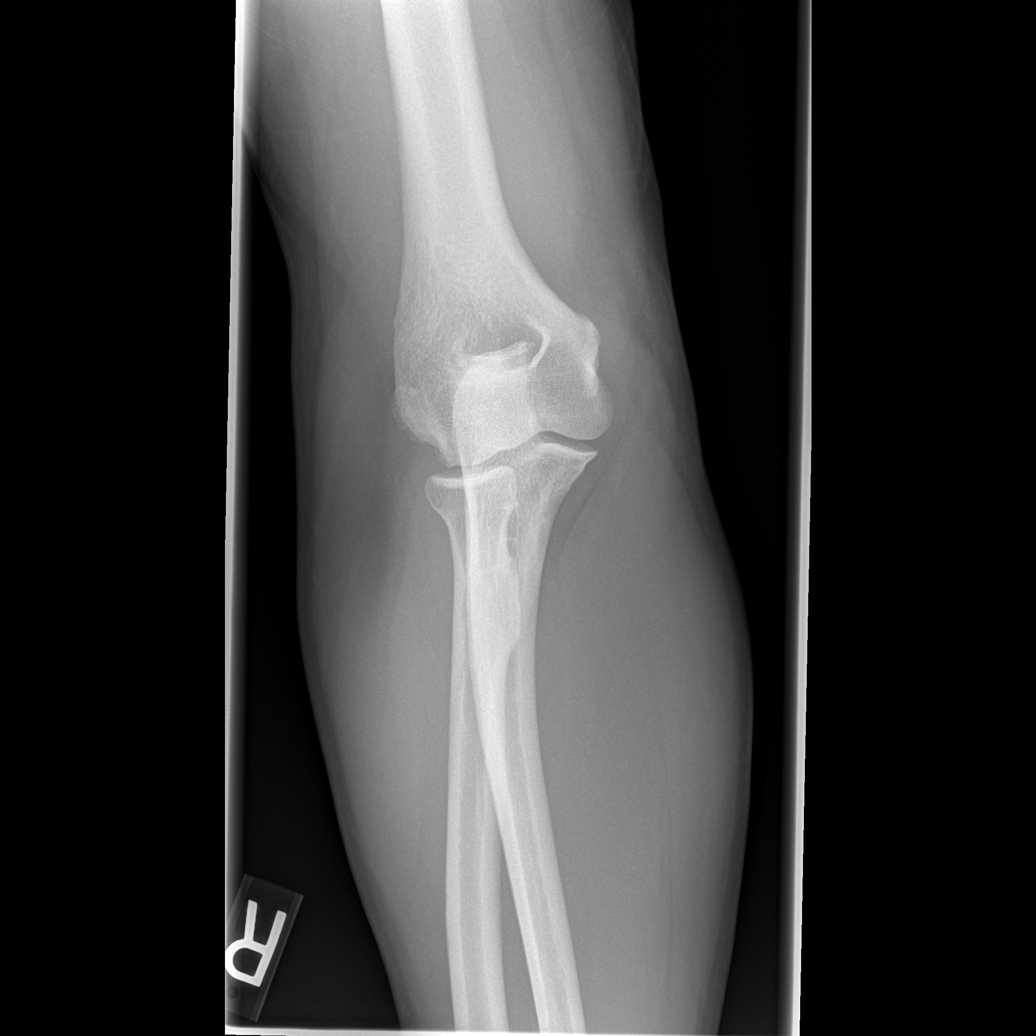

[x elbow joint obl. right (1 of 2)]
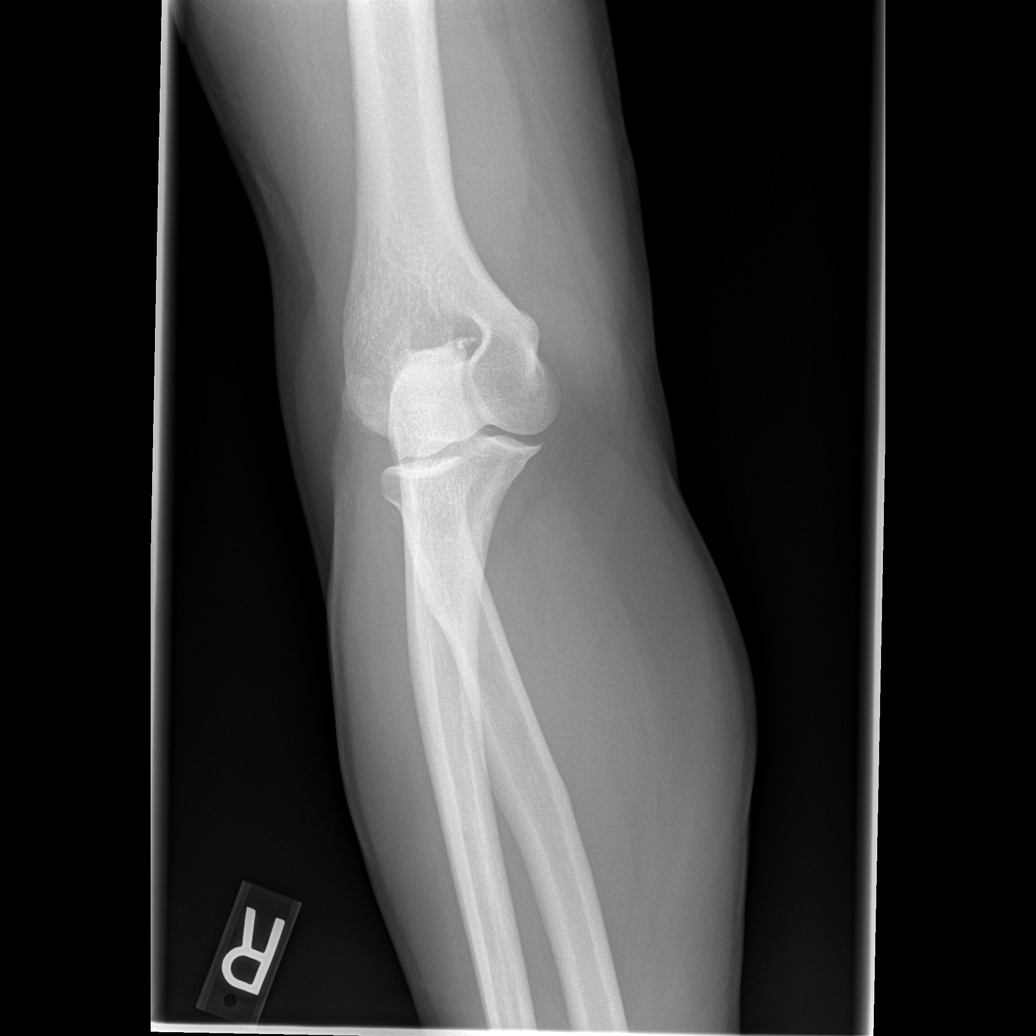

[x elbow joint obl. right (2 of 2)]
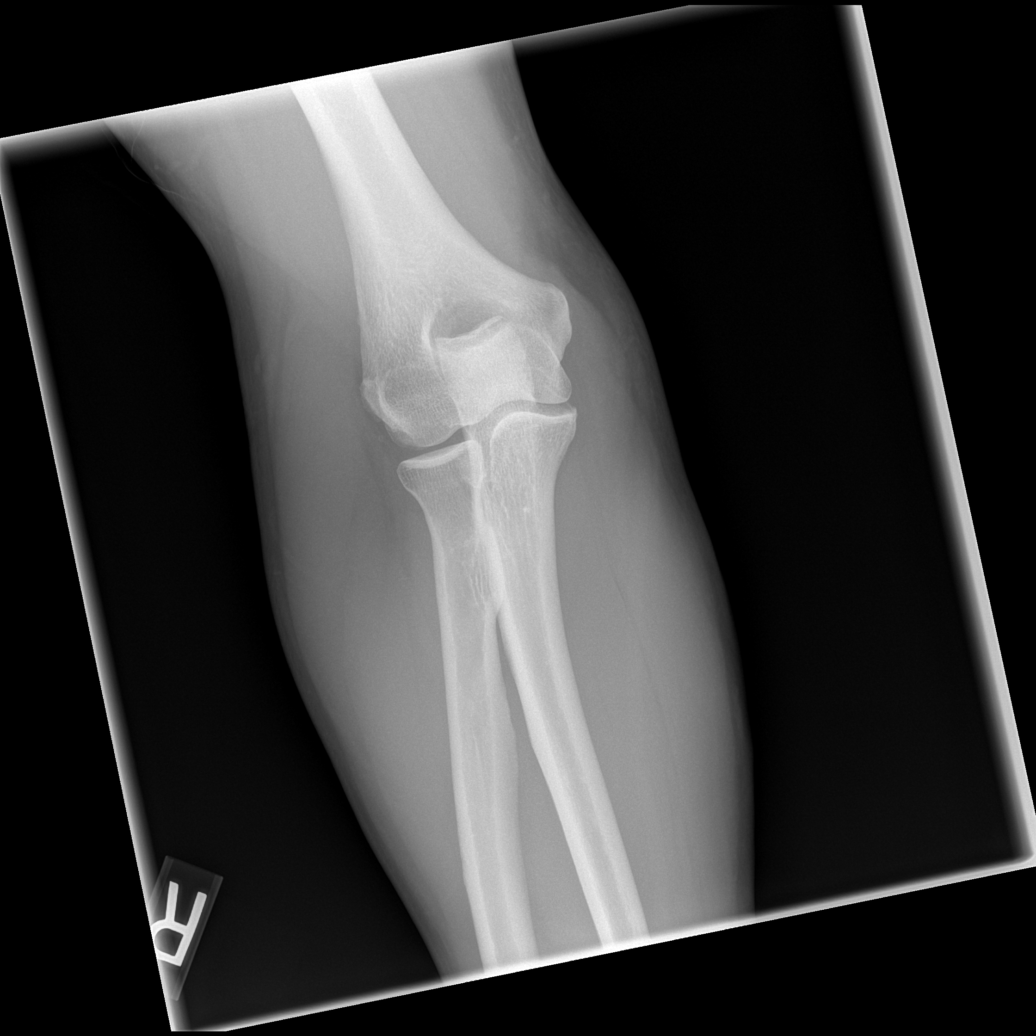

[x elbow joint lat right]
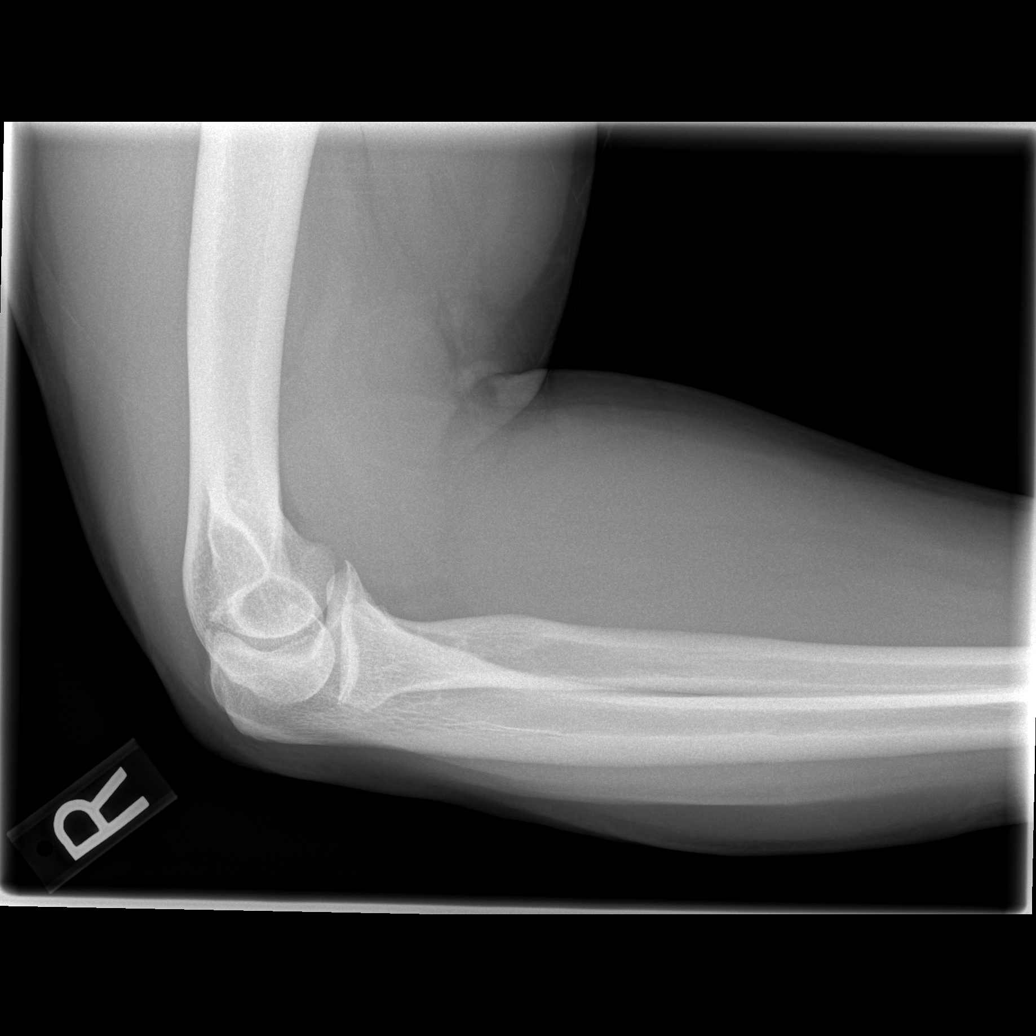

[4 of 4 positions shown; findings below may reference images not displayed]

FINDINGS: No acute fracture.  No dislocation.  No joint effusion.
IMPRESSION: No acute bony pathology.

## 2022-01-26 DEATH — deceased
